# Patient Record
Sex: Male | Born: 1975 | Race: White | Hispanic: No | Marital: Married | Smoking: Never smoker
Health system: Southern US, Community
[De-identification: ages and names within clinical notes are randomized; demographics above are authoritative.]

## PROBLEM LIST (undated history)

## (undated) DIAGNOSIS — T7840XA Allergy, unspecified, initial encounter: Secondary | ICD-10-CM

## (undated) HISTORY — DX: Allergy, unspecified, initial encounter: T78.40XA

---

## 1999-07-17 ENCOUNTER — Emergency Department (HOSPITAL_COMMUNITY): Admission: EM | Admit: 1999-07-17 | Discharge: 1999-07-17 | Payer: Self-pay | Admitting: *Deleted

## 2000-05-15 ENCOUNTER — Encounter: Payer: Self-pay | Admitting: Emergency Medicine

## 2000-05-15 ENCOUNTER — Emergency Department (HOSPITAL_COMMUNITY): Admission: EM | Admit: 2000-05-15 | Discharge: 2000-05-15 | Payer: Self-pay | Admitting: Emergency Medicine

## 2001-12-04 ENCOUNTER — Emergency Department (HOSPITAL_COMMUNITY): Admission: AC | Admit: 2001-12-04 | Discharge: 2001-12-04 | Payer: Self-pay

## 2002-10-26 ENCOUNTER — Emergency Department (HOSPITAL_COMMUNITY): Admission: EM | Admit: 2002-10-26 | Discharge: 2002-10-26 | Payer: Self-pay | Admitting: Emergency Medicine

## 2002-10-26 ENCOUNTER — Encounter: Payer: Self-pay | Admitting: Emergency Medicine

## 2003-07-08 ENCOUNTER — Emergency Department (HOSPITAL_COMMUNITY): Admission: EM | Admit: 2003-07-08 | Discharge: 2003-07-09 | Payer: Self-pay | Admitting: Emergency Medicine

## 2007-05-13 ENCOUNTER — Ambulatory Visit: Payer: Self-pay | Admitting: Cardiology

## 2007-05-22 ENCOUNTER — Ambulatory Visit: Payer: Self-pay

## 2007-05-22 ENCOUNTER — Encounter: Payer: Self-pay | Admitting: Cardiology

## 2010-09-13 NOTE — Assessment & Plan Note (Signed)
[facility]                            CARDIOLOGY OFFICE NOTE   QUE, Mike Dyer                        MRN:          [id number]  DATE:05/13/2007                            DOB:          1975-07-27    PLAN:  The patient is a 35 year old male with no prior cardiac history,  who I am asked to evaluate for chest pain.  He typically does not have  dyspnea on exertion, orthopnea, PND, pedal edema, palpitations, syncope,  presyncope or chest pain.  Over the past 1-2 weeks,  he has noticed an  occasional pain in the substernal area.  The patient describes it as a  discomfort.  It radiates to the back of his neck at times causing  headache.  He also complains of occasional tingling in his upper  extremities.  It has been intermittent for the past 1-2 weeks.  However,  it began yesterday around noon and has been persistent ever since.  Note, there is no associated nausea, vomiting, shortness of breath or  diaphoresis.  The pain is not pleuritic or positional nor it is related  to food.  It is not exertional.  He was seen at urgent care today and  cardiology was asked to further evaluate.  Note, the patient also was  given Motrin as well as a GI cocktail at Urgent Care.  Also note that  the patient states his symptoms do worsen when he thinks about what it  could be.   CURRENT MEDICATIONS:  He is on no medications at present.   ALLERGIES:  NO KNOWN DRUG ALLERGIES.   SOCIAL HISTORY:  He does not smoke and he denies any cocaine use.  He  rarely consumes alcohol.   FAMILY HISTORY:  Negative for coronary artery disease in the immediate  family.  His mother has COPD by his report.  He does state that he has  had uncles who have had congestive heart failure.   PAST MEDICAL HISTORY:  1. There is no diabetes mellitus, hypertension, hyperlipidemia.  2. He does have a history of pneumonia.  3. He also has a history of nephrolithiasis.   PAST SURGICAL HISTORY:   There are no previous surgeries.   REVIEW OF SYSTEMS:  He denies any headaches, fevers, chills.  There is  no productive cough or hemoptysis.  There is no dysphagia, odynophagia,  melena or hematochezia.  There is no dysuria, hematuria, rashes or  seizure activity.  There is no orthopnea, PND or pedal edema.  There is  no claudication.  The remainder of the systems are negative.   PHYSICAL EXAMINATION:  VITAL SIGNS:  Blood pressure 129/86,  pulse is  69, he weighs 231 pounds.  GENERAL:  He is well-developed, well-nourished in no acute distress.  He  is mildly anxious at present.  SKIN:  Warm and dry.  There is no peripheral clubbing.  BACK:  Normal.  HEENT:  Normal with normal eyelids.  NECK:  Supple with a normal upstroke bilaterally.  No bruits noted.  There is no jugular distention.  I cannot appreciate thyromegaly.  CHEST:  Clear to auscultation with normal expansion.  Note, he is  nontender in the chest area.  CARDIOVASCULAR:  Regular rate and rhythm.  Normal S1-S2.  There are no  murmurs, rubs or gallops noted.  ABDOMEN:  Nontender and nondistended with positive bowel sounds.  No  hepatosplenomegaly.  No mass appreciated.  There is no abdominal bruit.  He has 2+ femoral pulses bilaterally.  No bruits.  EXTREMITIES:  No edema and I can palpate no cords.  He has a negative  Homans' bilaterally.  He has 2+ posterior tibial pulses bilaterally.  NEUROLOGIC:  Grossly intact.   DIAGNOSTICS:  I do have an electrocardiogram from urgent care earlier  today.  This shows a sinus rhythm at a rate of 85.  The axis is normal.  There are no ST changes noted.   DIAGNOSES:  1. Atypical chest pain - etiology of this is not clear to me.  It may      be musculoskeletal.  However, he did travel to [place] recently for      a cruise.  There is a question of a mild pleuritic component.  We      will schedule him to have a D-dimer to exclude pulmonary embolus.      I think the likelihood that this  is coronary disease in his remote.      However, he is very concerned about these symptoms and would like      stress testing to fully evaluate his heart.  We will schedule a      stress echocardiogram.  If the above studies are normal, then we      would not pursue further cardiac workup.  He will see [place] back on an      as needed basis.  2. History of nephrolithiasis.  3. Remote history of pneumonia.  I have asked him to follow up with      his primary care physicians for any further issues.     [name], MD, [facility]  Electronically Signed    BSC/MedQ  DD: 05/13/2007  DT: 05/14/2007  Job #: [id number]   cc:   Pearline Cables, MD

## 2011-03-04 ENCOUNTER — Emergency Department (HOSPITAL_COMMUNITY): Payer: BC Managed Care – PPO

## 2011-03-04 ENCOUNTER — Emergency Department (HOSPITAL_COMMUNITY)
Admission: EM | Admit: 2011-03-04 | Discharge: 2011-03-04 | Disposition: A | Discharge status: Home / Self Care | Payer: BC Managed Care – PPO | Attending: Emergency Medicine | Admitting: Emergency Medicine

## 2011-03-04 ENCOUNTER — Inpatient Hospital Stay (INDEPENDENT_AMBULATORY_CARE_PROVIDER_SITE_OTHER)
Admission: RE | Admit: 2011-03-04 | Discharge: 2011-03-04 | Disposition: A | Discharge status: Home / Self Care | Payer: BC Managed Care – PPO | Source: Ambulatory Visit | Attending: Family Medicine | Admitting: Family Medicine

## 2011-03-04 DIAGNOSIS — Y9239 Other specified sports and athletic area as the place of occurrence of the external cause: Secondary | ICD-10-CM | POA: Insufficient documentation

## 2011-03-04 DIAGNOSIS — W219XXA Striking against or struck by unspecified sports equipment, initial encounter: Secondary | ICD-10-CM | POA: Insufficient documentation

## 2011-03-04 DIAGNOSIS — R51 Headache: Secondary | ICD-10-CM | POA: Insufficient documentation

## 2011-03-04 DIAGNOSIS — S1093XA Contusion of unspecified part of neck, initial encounter: Secondary | ICD-10-CM | POA: Insufficient documentation

## 2011-03-04 DIAGNOSIS — Y9364 Activity, baseball: Secondary | ICD-10-CM | POA: Insufficient documentation

## 2011-03-04 DIAGNOSIS — S0990XA Unspecified injury of head, initial encounter: Secondary | ICD-10-CM

## 2011-03-04 DIAGNOSIS — S0003XA Contusion of scalp, initial encounter: Secondary | ICD-10-CM | POA: Insufficient documentation

## 2012-09-21 IMAGING — CR DG FACIAL BONES COMPLETE 3+V
5 series · 5 of 5 positions shown · non-contrast
Comparison: None

CLINICAL DATA: Struck in the nose with a softball.

FACIAL BONES COMPLETE 3+V

[w waters *]
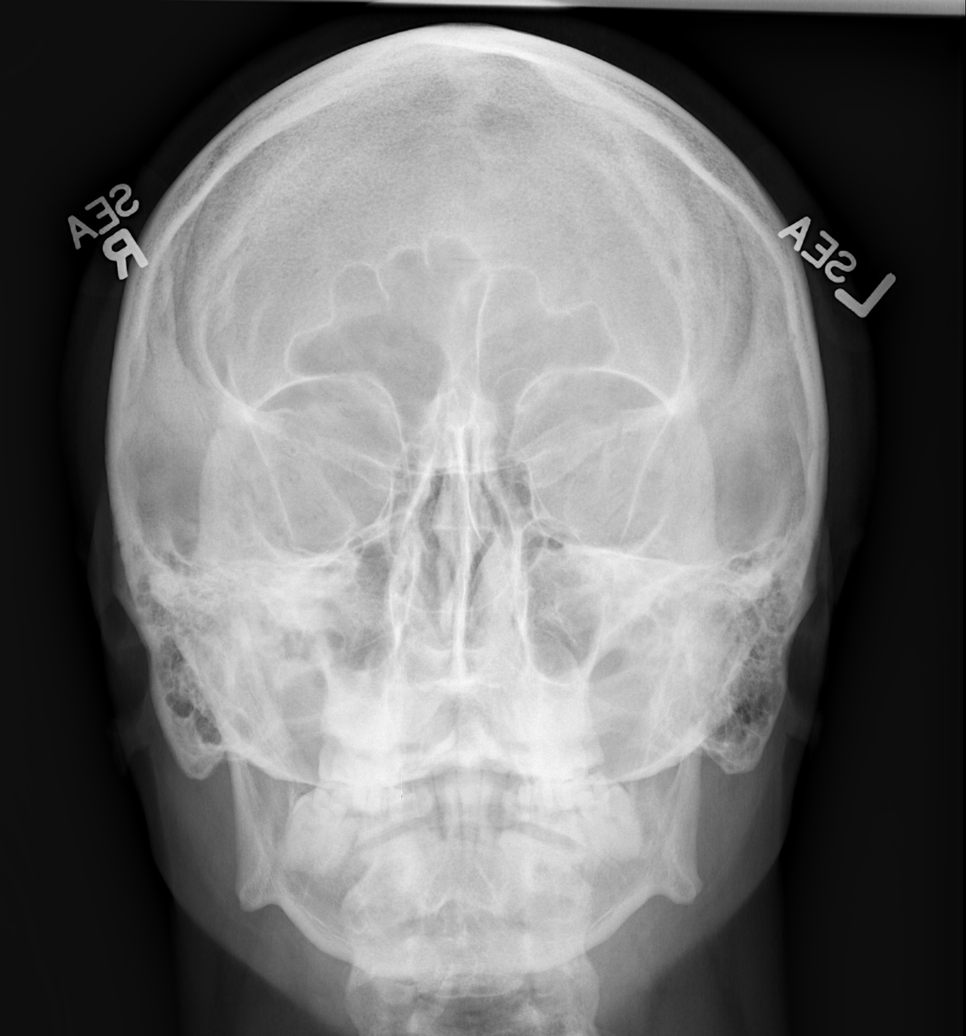

[[person_name] * (1 of 2)]
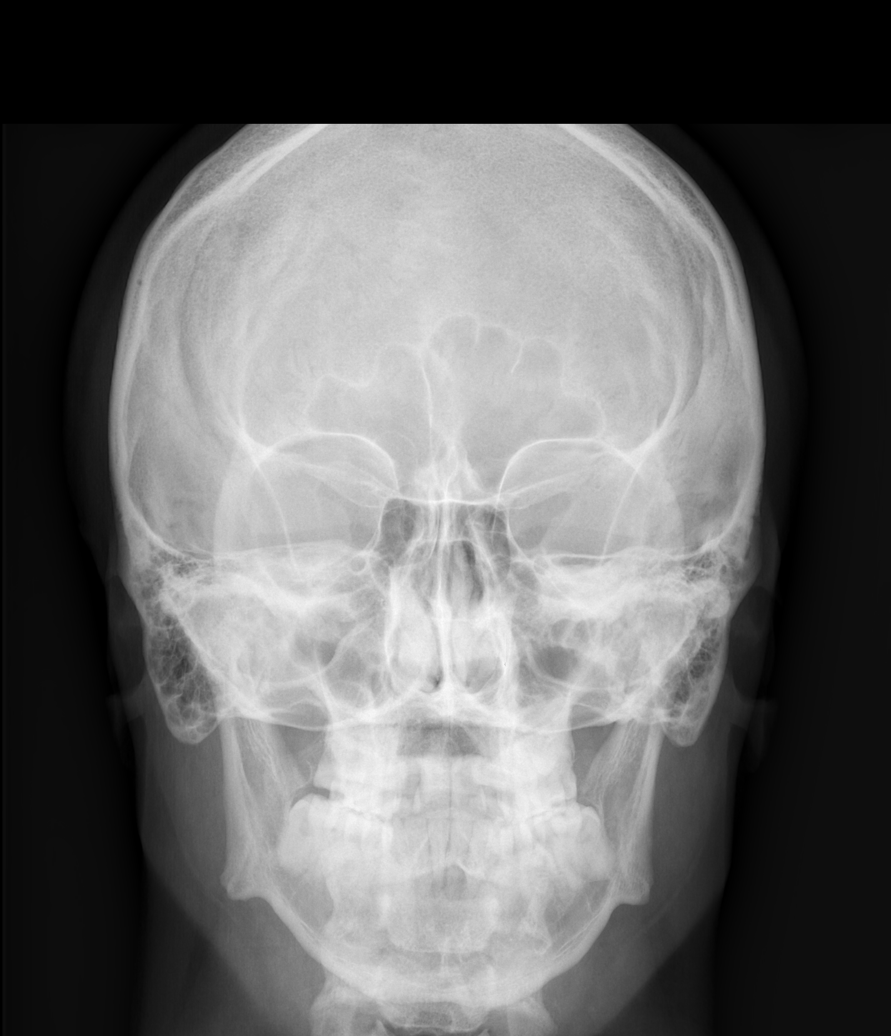

[w skull lat]
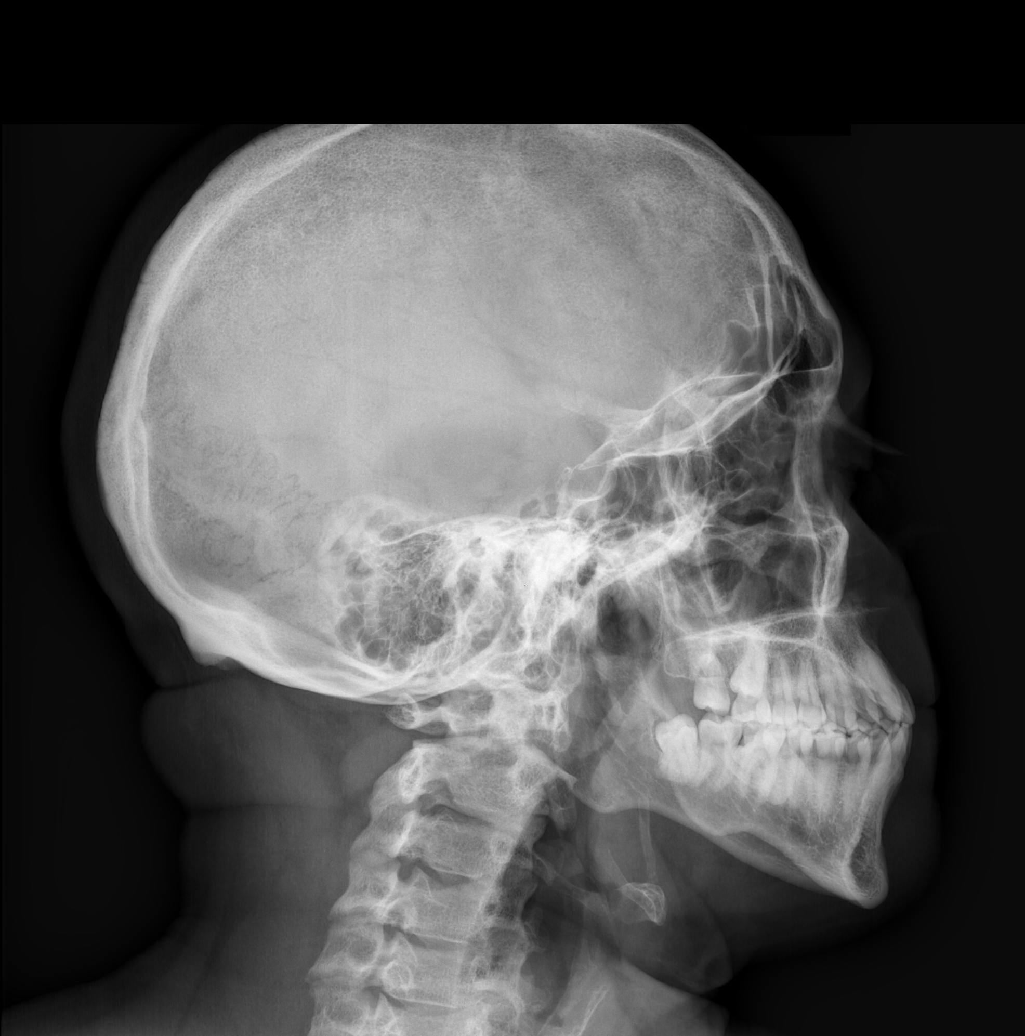

[[person_name] * (2 of 2)]
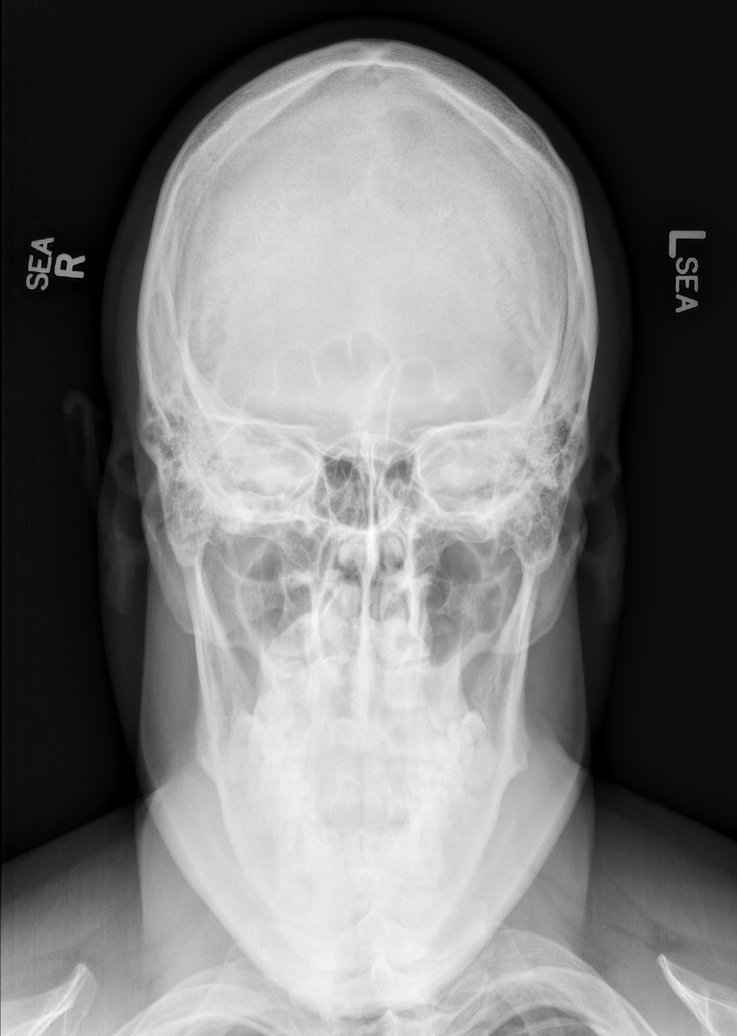

[w smv *]
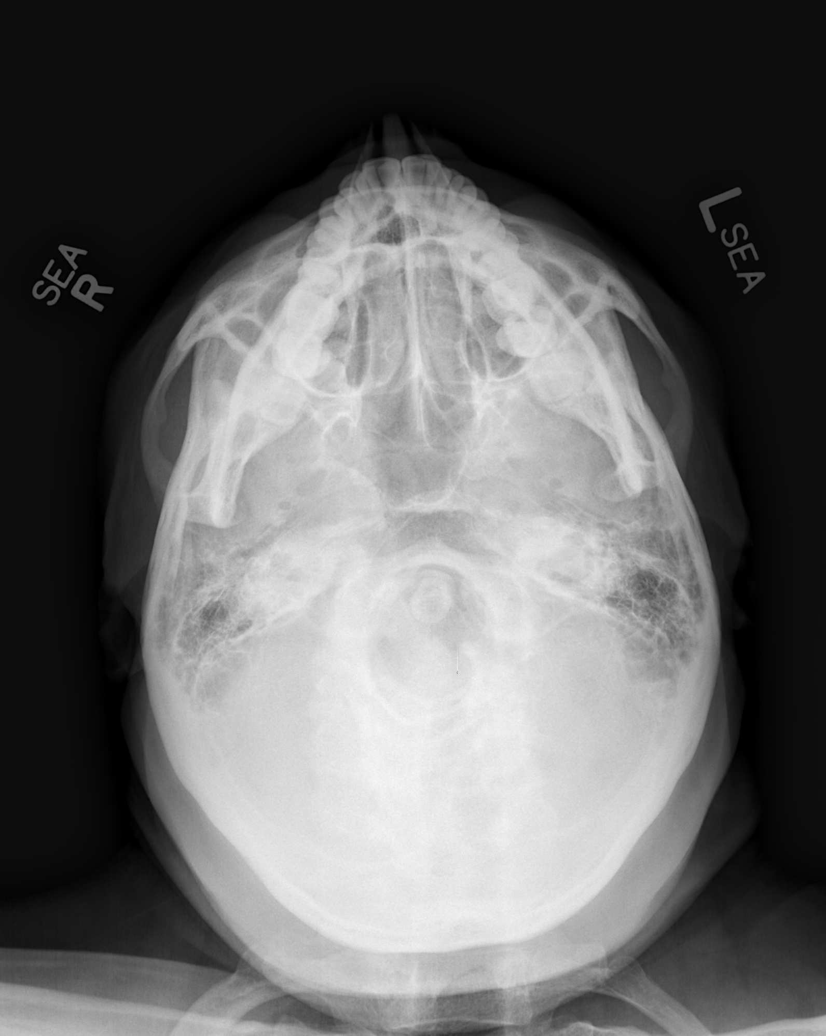

[5 of 5 positions shown; findings below may reference images not displayed]

FINDINGS: No evidence of facial fracture.  No fluid in the
sinuses.
IMPRESSION: Negative radiographs

## 2014-01-27 ENCOUNTER — Ambulatory Visit (INDEPENDENT_AMBULATORY_CARE_PROVIDER_SITE_OTHER): Payer: BC Managed Care – PPO | Admitting: Family Medicine

## 2014-01-27 VITALS — BP 126/84 | HR 96 | Temp 98.8°F | Resp 18 | Ht 69.0 in | Wt 236.8 lb

## 2014-01-27 DIAGNOSIS — J039 Acute tonsillitis, unspecified: Secondary | ICD-10-CM

## 2014-01-27 MED ORDER — CEFDINIR 300 MG PO CAPS: 300.0000 mg | ORAL_CAPSULE | Freq: Two times a day (BID) | ORAL | Status: AC

## 2014-01-27 NOTE — Progress Notes (Signed)
      Patient ID: Mike Dyer MRN: [id number], DOB: May 21, 1975, 38 y.o. Date of Encounter: 01/27/2014, 11:36 AM  Primary Physician: No primary provider on file.  Chief Complaint:  Chief Complaint  Patient presents with  . Sore Throat    since sunday--fever at 101.0--chills--night sweat--n/v--no cough    HPI: 38 y.o. year old male presents with 2 day history of sore throat. Subjective fever and chills. No cough, congestion, rhinorrhea, sinus pressure, otalgia, or headache. Normal hearing. No GI complaints. Able to swallow saliva, but hurts to do so. Decreased appetite secondary to sore throat.   Past Medical History  Diagnosis Date  . Allergy      Home Meds: Prior to Admission medications   Medication Sig Start Date End Date Taking? Authorizing Provider  cefdinir (OMNICEF) 300 MG capsule Take 1 capsule (300 mg total) by mouth 2 (two) times daily. 01/27/14   Elvina SidleKurt Latrice Storlie, MD    Allergies: No Known Allergies  History   Social History  . Marital Status: Married    Spouse Name: N/A    Number of Children: N/A  . Years of Education: N/A   Occupational History  . Not on file.   Social History Main Topics  . Smoking status: Never Smoker   . Smokeless tobacco: Not on file  . Alcohol Use: 0.5 - 1.0 oz/week    1-2 drink(s) per week  . Drug Use: No  . Sexual Activity: Not on file   Other Topics Concern  . Not on file   Social History Narrative  . No narrative on file     Review of Systems: Constitutional: positive for chills, fever, night sweat; negative for weight changes HEENT: see above Cardiovascular: negative for chest pain or palpitations Respiratory: negative for hemoptysis, wheezing, or shortness of breath Abdominal: negative for abdominal pain, nausea, vomiting or diarrhea Dermatological: negative for rash Neurologic: negative for headache   Physical Exam: Blood pressure 126/84, pulse 96, temperature 98.8 F (37.1 C), temperature source Oral, resp.  rate 18, height 5\' 9"  (1.753 m), weight 236 lb 12.8 oz (107.412 kg), SpO2 97.00%., Body mass index is 34.95 kg/(m^2). General: Well developed, well nourished, in no acute distress. Head: Normocephalic, atraumatic, eyes without discharge, sclera non-icteric, nares are patent. Bilateral auditory canals clear, TM's are without perforation, pearly grey with reflective cone of light bilaterally. No sinus TTP. Oral cavity moist, dentition normal. Posterior pharynx with post nasal drip and mild erythema. Marked tonsillar exudate. Neck: Supple. No thyromegaly. Full ROM. No lymphadenopathy. Lungs: Clear bilaterally to auscultation without wheezes, rales, or rhonchi. Breathing is unlabored. Heart: RRR with S1 S2. No murmurs, rubs, or gallops appreciated. Abdomen: Soft, non-tender, non-distended with normoactive bowel sounds. No hepatomegaly. No rebound/guarding. No obvious abdominal masses. Msk:  Strength and tone normal for age. Extremities: No clubbing or cyanosis. No edema. Neuro: Alert and oriented X 3. Moves all extremities spontaneously. CNII-XII grossly in tact. Psych:  Responds to questions appropriately with a normal affect.     ASSESSMENT AND PLAN:  38 y.o. year old male with acute pharyngitis Acute tonsillitis - Plan: Culture, Group A Strep, cefdinir (OMNICEF) 300 MG capsule   -gargles -Tylenol/Motrin prn -Rest/fluids -RTC precautions -RTC 3-5 days if no improvement  Signed, Elvina SidleKurt [name], MD 01/27/2014 11:36 AM

## 2014-01-27 NOTE — Patient Instructions (Signed)

## 2014-01-28 LAB — CULTURE, GROUP A STREP
